# Patient Record
Sex: Female | Born: 1990 | Race: White | Hispanic: No | Marital: Single | Smoking: Never smoker
Health system: Southern US, Community
[De-identification: ages and names within clinical notes are randomized; demographics above are authoritative.]

## PROBLEM LIST (undated history)

## (undated) DIAGNOSIS — J45909 Unspecified asthma, uncomplicated: Secondary | ICD-10-CM

## (undated) DIAGNOSIS — F0781 Postconcussional syndrome: Secondary | ICD-10-CM

## (undated) HISTORY — PX: WISDOM TOOTH EXTRACTION: SHX21

## (undated) HISTORY — DX: Postconcussional syndrome: F07.81

---
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
## 2012-02-12 ENCOUNTER — Ambulatory Visit (INDEPENDENT_AMBULATORY_CARE_PROVIDER_SITE_OTHER): Payer: BC Managed Care – PPO | Admitting: Internal Medicine

## 2012-02-12 ENCOUNTER — Encounter: Payer: Self-pay | Admitting: Women's Health

## 2012-02-12 ENCOUNTER — Ambulatory Visit: Payer: BC Managed Care – PPO

## 2012-02-12 ENCOUNTER — Ambulatory Visit (INDEPENDENT_AMBULATORY_CARE_PROVIDER_SITE_OTHER): Payer: BC Managed Care – PPO | Admitting: Women's Health

## 2012-02-12 VITALS — BP 110/66 | HR 65 | Temp 98.1°F | Resp 16 | Ht 66.25 in | Wt 175.2 lb

## 2012-02-12 VITALS — BP 108/72 | Ht 66.75 in | Wt 173.0 lb

## 2012-02-12 DIAGNOSIS — N926 Irregular menstruation, unspecified: Secondary | ICD-10-CM | POA: Insufficient documentation

## 2012-02-12 DIAGNOSIS — S300XXA Contusion of lower back and pelvis, initial encounter: Secondary | ICD-10-CM

## 2012-02-12 DIAGNOSIS — Z01419 Encounter for gynecological examination (general) (routine) without abnormal findings: Secondary | ICD-10-CM

## 2012-02-12 DIAGNOSIS — S301XXA Contusion of abdominal wall, initial encounter: Secondary | ICD-10-CM

## 2012-02-12 DIAGNOSIS — M545 Low back pain: Secondary | ICD-10-CM

## 2012-02-12 MED ORDER — NORETHIN ACE-ETH ESTRAD-FE 1-20 MG-MCG PO TABS: 1.0000 | ORAL_TABLET | Freq: Every day | ORAL | Status: DC

## 2012-02-12 MED ORDER — IBUPROFEN 600 MG PO TABS: 600.0000 mg | ORAL_TABLET | Freq: Three times a day (TID) | ORAL | Status: AC | PRN

## 2012-02-12 NOTE — Patient Instructions (Addendum)
Health Maintenance, 18- to 19-Year-Old SCHOOL PERFORMANCE After high school completion, the Deanna Frank adult may be attending college, technical or vocational school, or entering the military or the work force. SOCIAL AND EMOTIONAL DEVELOPMENT The Deanna Frank adult establishes adult relationships and explores sexual identity. Deanna Frank adults may be living at home or in a college dorm or apartment. Increasing independence is important with Deanna Frank adults. Throughout adolescence, teens should assume responsibility of their own health care. IMMUNIZATIONS Most Deanna Frank adults should be fully vaccinated. A booster dose of Tdap (tetanus, diphtheria, and pertussis, or "whooping cough"), a dose of meningococcal vaccine to protect against a certain type of bacterial meningitis, hepatitis A, human papillomarvirus (HPV), chickenpox, or measles vaccines may be indicated, if not given at an earlier age. Annual influenza or "flu" vaccination should be considered during flu season.  TESTING Annual screening for vision and hearing problems is recommended. Vision should be screened objectively at least once between 18 and 19 years of age. The Deanna Frank adult may be screened for anemia or tuberculosis. Deanna Frank adults should have a blood test to check for high cholesterol during this time period. Deanna Frank adults should be screened for use of alcohol and drugs. If the Deanna Frank adult is sexually active, screening for sexually transmitted infections, pregnancy, or HIV may be performed. Screening for cervical cancer should be performed within 3 years of beginning sexual activity. NUTRITION AND ORAL HEALTH  Adequate calcium intake is important. Consume 3 servings of low-fat milk and dairy products daily. For those who do not drink milk or consume dairy products, calcium enriched foods, such as juice, bread, or cereal, dark, leafy greens, or canned fish are alternate sources of calcium.   Drink plenty of water. Limit fruit juice to 8 to 12 ounces per day.  Avoid sugary beverages or sodas.   Discourage skipping meals, especially breakfast. Teens should eat a good variety of vegetables and fruits, as well as lean meats.   Avoid high fat, high salt, and high sugar foods, such as candy, chips, and cookies.   Encourage Deanna Frank adults to participate in meal planning and preparation.   Eat meals together as a family whenever possible. Encourage conversation at mealtime.   Limit fast food choices and eating out at restaurants.   Brush teeth twice a day and floss.   Schedule dental exams twice a year.  SLEEP Regular sleep habits are important. PHYSICAL, SOCIAL, AND EMOTIONAL DEVELOPMENT  One hour of regular physical activity daily is recommended. Continue to participate in sports.   Encourage Deanna Frank adults to develop their own interests and consider community service or volunteerism.   Provide guidance to the Deanna Frank adult in making decisions about college and work plans.   Make sure that Deanna Frank adults know that they should never be in a situation that makes them uncomfortable, and they should tell partners if they do not want to engage in sexual activity.   Talk to the Deanna Frank adult about body image. Eating disorders may be noted at this time. Deanna Frank adults may also be concerned about being overweight. Monitor the Deanna Frank adult for weight gain or loss.   Mood disturbances, depression, anxiety, alcoholism, or attention problems may be noted in Deanna Frank adults. Talk to the caregiver if there are concerns about mental illness.   Negotiate limit setting and independent decision making.   Encourage the Deanna Frank adult to handle conflict without physical violence.   Avoid loud noises which may impair hearing.   Limit television and computer time to 2 hours per day.   Individuals who engage in excessive sedentary activity are more likely to become overweight.  RISK BEHAVIORS  Sexually active Deanna Frank adults need to take precautions against pregnancy and sexually  transmitted infections. Talk to Deanna Frank adults about contraception.   Provide a tobacco-free and drug-free environment for the Deanna Frank adult. Talk to the Deanna Frank adult about drug, tobacco, and alcohol use among friends or at friends' homes. Make sure the Deanna Frank adult knows that smoking tobacco or marijuana and taking drugs have health consequences and may impact brain development.   Teach the Deanna Frank adult about appropriate use of over-the-counter or prescription medicines.   Establish guidelines for driving and for riding with friends.   Talk to Deanna Frank adults about the risks of drinking and driving or boating. Encourage the Deanna Frank adult to call you if he or she or friends have been drinking or using drugs.   Remind Deanna Frank adults to wear seat belts at all times in cars and life vests in boats.   Deanna Frank adults should always wear a properly fitted helmet when they are riding a bicycle.   Use caution with all-terrain vehicles (ATVs) or other motorized vehicles.   Do not keep handguns in the home. (If you do, the gun and ammunition should be locked separately and out of the Deanna Frank adult's access.)   Equip your home with smoke detectors and change the batteries regularly. Make sure all family members know the fire escape plans for your home.   Teach Deanna Frank adults not to swim alone and not to dive in shallow water.   All individuals should wear sunscreen that protects against UVA and UVB light with at least a sun protection factor (SPF) of 30 when out in the sun. This minimizes sun burning.  WHAT'S NEXT? Deanna Frank adults should visit their pediatrician or family physician yearly. By Deanna Frank adulthood, health care should be transitioned to a family physician or internal medicine specialist. Sexually active females may want to begin annual physical exams with a gynecologist. Document Released: 09/24/2006 Document Revised: 06/18/2011 Document Reviewed: 10/14/2006 ExitCare Patient Information 2012 [employer]. 

## 2012-02-12 NOTE — Progress Notes (Signed)
LAVONNA LAMPRON [birth year] [id number]    History:    The patient presents for annual exam.  Cycles vary from every 3-12 weeks for 5-7 days. [place]. States cycles space when active with sports. Gardasil series completed several years ago. Denies any health problems, fell in the shower yesterday and hit her head, states feeling better now, was seen at an urgent care..   Past medical history, past surgical history, family history and social history were all reviewed and documented in the EPIC chart. Graduated third in her class, attending [employer]. 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   ROS:  A  ROS was performed and pertinent positives and negatives are included in the history.  Exam:  Filed Vitals:   02/12/12 1423  BP: 108/72    General appearance:  Normal Head/Neck:  Normal, without cervical or supraclavicular adenopathy. Thyroid:  Symmetrical, normal in size, without palpable masses or nodularity. Respiratory  Effort:  Normal  Auscultation:  Clear without wheezing or rhonchi Cardiovascular  Auscultation:  Regular rate, without rubs, murmurs or gallops  Edema/varicosities:  Not grossly evident Abdominal  Soft,nontender, without masses, guarding or rebound.  Liver/spleen:  No organomegaly noted  Hernia:  None appreciated  Skin  Inspection:  Grossly normal  Palpation:  Grossly normal Neurologic/psychiatric  Orientation:  Normal with appropriate conversation.  Mood/affect:  Normal  Genitourinary    Breasts: Examined lying and sitting.     Right: Without masses, retractions, discharge or axillary adenopathy.     Left: Without masses, retractions, discharge or axillary adenopathy.   Inguinal/mons:  Normal without inguinal adenopathy  External genitalia:  Normal  BUS/Urethra/Skene's glands:  Normal  Bladder:  Normal  Vagina:  Normal  Cervix:  Normal  Uterus:   normal in size, shape and contour.  Midline and mobile  Adnexa/parametria:      Rt: Without masses or tenderness.   Lt: Without masses or tenderness.  Anus and perineum: Normal    Assessment/Plan:  21 y.o. SWF [place] for annual exam with complaint of irregular cycles, anywhere between 3 and 12 weeks.  Irregular cycles Gardasil completed  Plan: CBC, TSH, prolactin, UA. Reviewed options to help with cycle regulation. Would like to try birth control pills. Loestrin 1/20 prescription, proper use, given and reviewed. Will start first day of next cycle. Instructed to call if cycles do not regulate. Reviewed importance of condoms if becomes sexually active. SBE's, exercise, calcium rich diet, MVI daily encouraged, campus safety reviewed.    Harrington Challenger WHNP, 3:01 PM 02/12/2012

## 2012-02-12 NOTE — Progress Notes (Signed)
  Subjective:    Patient ID: Deanna Frank, female    DOB: [birth year], 21 y.o.   MRN: [id number]  HPI At home, slipped in tub and fell hard on her rear end, felt immediate pain in mid lumbar vertebrae. No radiation of pain, notingling and no hx of stinger sxs, no weakness or numbness. Only has pain at L2-4   Review of Systems Concussions x4 and continues to play soccer    Objective:   Physical Exam  Constitutional: She is oriented to person, place, and time.  Musculoskeletal: She exhibits tenderness.  Neurological: She is alert and oriented to person, place, and time. She has normal strength and normal reflexes. No sensory deficit. Coordination and gait normal.  Reflex Scores:      Patellar reflexes are 2+ on the right side and 2+ on the left side.      Achilles reflexes are 2+ on the right side and 2+ on the left side. Skin: Skin is warm, dry and intact. No abrasion and no ecchymosis noted.          No eccymosis or swelling seen  Psychiatric: She has a normal mood and affect.   Point tender at mid lumbar spine  [facility] reading (PRIMARY) by  Dr.Guest.       Assessment & Plan:  Contusion/strain ls spine Back manual

## 2012-02-14 LAB — URINALYSIS W MICROSCOPIC + REFLEX CULTURE
Bacteria, UA: NONE SEEN
Bilirubin Urine: NEGATIVE
Casts: NONE SEEN
Crystals: NONE SEEN
Glucose, UA: 500 mg/dL — AB
Ketones, ur: NEGATIVE mg/dL
Specific Gravity, Urine: 1.032 — ABNORMAL HIGH (ref 1.005–1.030)
Urobilinogen, UA: 0.2 mg/dL (ref 0.0–1.0)

## 2012-02-17 ENCOUNTER — Other Ambulatory Visit: Payer: Self-pay | Admitting: Women's Health

## 2012-02-17 DIAGNOSIS — R81 Glycosuria: Secondary | ICD-10-CM

## 2012-04-29 ENCOUNTER — Other Ambulatory Visit: Payer: Self-pay | Admitting: Women's Health

## 2012-04-29 DIAGNOSIS — N926 Irregular menstruation, unspecified: Secondary | ICD-10-CM

## 2012-04-29 MED ORDER — NORETHIN ACE-ETH ESTRAD-FE 1-20 MG-MCG PO TABS: 1.0000 | ORAL_TABLET | Freq: Every day | ORAL | Status: DC

## 2012-05-31 ENCOUNTER — Other Ambulatory Visit: Payer: Self-pay | Admitting: Family Medicine

## 2012-05-31 NOTE — Telephone Encounter (Signed)
Please pull paper chart. Medication not on list in [facility].

## 2012-06-02 NOTE — Telephone Encounter (Signed)
Chart pulled at nurses station pa pool [id number]

## 2012-07-09 ENCOUNTER — Emergency Department (HOSPITAL_BASED_OUTPATIENT_CLINIC_OR_DEPARTMENT_OTHER)
Admission: EM | Admit: 2012-07-09 | Discharge: 2012-07-09 | Disposition: A | Discharge status: Home / Self Care | Payer: BC Managed Care – PPO | Attending: Emergency Medicine | Admitting: Emergency Medicine

## 2012-07-09 ENCOUNTER — Encounter (HOSPITAL_BASED_OUTPATIENT_CLINIC_OR_DEPARTMENT_OTHER): Payer: Self-pay | Admitting: *Deleted

## 2012-07-09 DIAGNOSIS — Z79899 Other long term (current) drug therapy: Secondary | ICD-10-CM | POA: Insufficient documentation

## 2012-07-09 DIAGNOSIS — J45990 Exercise induced bronchospasm: Secondary | ICD-10-CM | POA: Insufficient documentation

## 2012-07-09 DIAGNOSIS — Z23 Encounter for immunization: Secondary | ICD-10-CM | POA: Insufficient documentation

## 2012-07-09 DIAGNOSIS — Z87820 Personal history of traumatic brain injury: Secondary | ICD-10-CM | POA: Insufficient documentation

## 2012-07-09 DIAGNOSIS — Z203 Contact with and (suspected) exposure to rabies: Secondary | ICD-10-CM | POA: Insufficient documentation

## 2012-07-09 HISTORY — DX: Unspecified asthma, uncomplicated: J45.909

## 2012-07-09 MED ORDER — RABIES IMMUNE GLOBULIN 150 UNIT/ML IM INJ
20.0000 [IU]/kg | INJECTION | Freq: Once | INTRAMUSCULAR | Status: AC
  Administered 2012-07-09: 1500 [IU] via INTRAMUSCULAR
  Administered 2012-07-09: 300 [IU] via INTRAMUSCULAR

## 2012-07-09 MED ORDER — RABIES VACCINE, PCEC IM SUSR
1.0000 mL | Freq: Once | INTRAMUSCULAR | Status: AC
  Administered 2012-07-09: 1 mL via INTRAMUSCULAR
  Filled 2012-07-09 (×2): qty 1

## 2012-07-09 MED ORDER — RABIES IMMUNE GLOBULIN 150 UNIT/ML IM INJ
INJECTION | INTRAMUSCULAR | Status: AC
  Administered 2012-07-09: 300 [IU] via INTRAMUSCULAR
  Filled 2012-07-09: qty 12

## 2012-07-09 NOTE — ED Notes (Signed)
Pt states her family had a bat found in their house.  No active bite or exposure.

## 2012-07-09 NOTE — ED Notes (Signed)
Bat found in house, possible bite, no indications of bite

## 2012-07-09 NOTE — ED Notes (Signed)
Total of 1500 mg Rabies Immune Globulin given IM

## 2012-07-09 NOTE — ED Provider Notes (Signed)
History     CSN: [id number]  Arrival date & time 07/09/12  1007   First MD Initiated Contact with Patient 07/09/12 1055      Chief Complaint  Patient presents with  . Rabies Injection    (Consider location/radiation/quality/duration/timing/severity/associated sxs/prior treatment) HPI Pt reports there was a bat in their house for several days earlier in the week. No known bites. Pt here with several family members to begin Rabies series. She is asymptomatic.  Past Medical History  Diagnosis Date  . Concussion syndrome   . Asthma     exercise induced    History reviewed. No pertinent past surgical history.  Family History  Problem Relation Age of Onset  . Breast cancer Paternal Grandmother   . Cancer Paternal Grandfather     COLON    History  Substance Use Topics  . Smoking status: Never Smoker   . Smokeless tobacco: Never Used  . Alcohol Use: No    OB History    Grav Para Term Preterm Abortions TAB SAB Ect Mult Living                  Review of Systems  All other systems reviewed and are negative.    Allergies  Doxycycline  Home Medications   Current Outpatient Rx  Name  Route  Sig  Dispense  Refill  . NORETHIN ACE-ETH ESTRAD-FE 1-20 MG-MCG PO TABS   Oral   Take 1 tablet by mouth daily.   3 Package   4   . PROAIR HFA 108 (90 BASE) MCG/ACT IN AERS      inhale 2 puffs by mouth 1 MINUTE APART every 4 to 6 hours as directed   1 Inhaler   3     BP 114/71  Pulse 72  Temp 98.2 F (36.8 C)  Resp 18  SpO2 100%  Physical Exam  Constitutional: She is oriented to person, place, and time. She appears well-developed and well-nourished.  HENT:  Head: Normocephalic and atraumatic.  Neck: Neck supple.  Pulmonary/Chest: Effort normal.  Neurological: She is alert and oriented to person, place, and time. No cranial nerve deficit.  Psychiatric: She has a normal mood and affect. Her behavior is normal.    ED Course  Procedures (including critical  care time)  Labs Reviewed - No data to display No results found.   No diagnosis found.    MDM  Begin Rabies vaccination series        Ashlan Dignan B. Bernette Mayers, MD 07/09/12 1104

## 2012-07-12 ENCOUNTER — Encounter (HOSPITAL_COMMUNITY): Payer: Self-pay | Admitting: *Deleted

## 2012-07-12 ENCOUNTER — Emergency Department (INDEPENDENT_AMBULATORY_CARE_PROVIDER_SITE_OTHER)
Admission: EM | Admit: 2012-07-12 | Discharge: 2012-07-12 | Disposition: A | Discharge status: Home / Self Care | Payer: BC Managed Care – PPO | Source: Home / Self Care

## 2012-07-12 DIAGNOSIS — Z23 Encounter for immunization: Secondary | ICD-10-CM

## 2012-07-12 MED ORDER — RABIES VACCINE, PCEC IM SUSR
1.0000 mL | Freq: Once | INTRAMUSCULAR | Status: AC
  Administered 2012-07-12: 1 mL via INTRAMUSCULAR

## 2012-07-12 MED ORDER — RABIES VACCINE, PCEC IM SUSR
INTRAMUSCULAR | Status: AC
  Filled 2012-07-12: qty 1

## 2012-07-12 NOTE — ED Notes (Signed)
Presents for rabies injection. 

## 2012-07-16 ENCOUNTER — Emergency Department (INDEPENDENT_AMBULATORY_CARE_PROVIDER_SITE_OTHER)
Admission: EM | Admit: 2012-07-16 | Discharge: 2012-07-16 | Disposition: A | Discharge status: Home / Self Care | Payer: BC Managed Care – PPO | Source: Home / Self Care

## 2012-07-16 ENCOUNTER — Encounter (HOSPITAL_COMMUNITY): Payer: Self-pay | Admitting: *Deleted

## 2012-07-16 DIAGNOSIS — Z23 Encounter for immunization: Secondary | ICD-10-CM

## 2012-07-16 MED ORDER — RABIES VACCINE, PCEC IM SUSR
INTRAMUSCULAR | Status: AC
  Filled 2012-07-16: qty 1

## 2012-07-16 MED ORDER — RABIES VACCINE, PCEC IM SUSR
1.0000 mL | Freq: Once | INTRAMUSCULAR | Status: AC
  Administered 2012-07-16: 1 mL via INTRAMUSCULAR

## 2012-07-16 NOTE — ED Notes (Signed)
Presents for rabies injection. 

## 2012-07-23 ENCOUNTER — Encounter (HOSPITAL_COMMUNITY): Payer: Self-pay | Admitting: Emergency Medicine

## 2012-07-23 ENCOUNTER — Emergency Department (INDEPENDENT_AMBULATORY_CARE_PROVIDER_SITE_OTHER)
Admission: EM | Admit: 2012-07-23 | Discharge: 2012-07-23 | Disposition: A | Discharge status: Home / Self Care | Payer: BC Managed Care – PPO | Source: Home / Self Care

## 2012-07-23 DIAGNOSIS — Z203 Contact with and (suspected) exposure to rabies: Secondary | ICD-10-CM

## 2012-07-23 MED ORDER — RABIES VACCINE, PCEC IM SUSR
INTRAMUSCULAR | Status: AC
  Filled 2012-07-23: qty 1

## 2012-07-23 MED ORDER — RABIES VACCINE, PCEC IM SUSR
1.0000 mL | Freq: Once | INTRAMUSCULAR | Status: AC
  Administered 2012-07-23: 1 mL via INTRAMUSCULAR

## 2012-07-23 NOTE — ED Notes (Signed)
Here for the 4th rabies vacc 

## 2012-12-13 ENCOUNTER — Telehealth: Payer: Self-pay

## 2012-12-13 NOTE — Telephone Encounter (Signed)
Pt's mother is calling stating she would like to have a referral to a neurologist for Deanna Frank due to concussions   Best number is [phone]

## 2012-12-13 NOTE — Telephone Encounter (Signed)
1 yr since office visit for this, can we refer? Or do we need to see and evaluate first?

## 2012-12-13 NOTE — Telephone Encounter (Signed)
Please get more more information. Is something new going on?

## 2012-12-14 NOTE — Telephone Encounter (Signed)
lmom to cb. 

## 2012-12-14 NOTE — Telephone Encounter (Signed)
Called mother, patient went swimming at [place] this weekend/ and has had a new injury. She hit her head again, was sent to Urgent care in [place], they recommended a visit with Neurology. Please advise.

## 2012-12-14 NOTE — Telephone Encounter (Signed)
We will be happy to refer, however for continuity of care have them come in so we can get a history and perform an exam. We have to know what we are referring for other than a head injury that we did not evaluate them for.

## 2012-12-15 ENCOUNTER — Telehealth: Payer: Self-pay

## 2012-12-15 NOTE — Telephone Encounter (Signed)
Called her. No answer.

## 2012-12-15 NOTE — Telephone Encounter (Signed)
Thanks I called to advise. Left detailed message.

## 2012-12-15 NOTE — Telephone Encounter (Signed)
Pts mother is calling to speak to someone  Call back number [phone]

## 2013-04-23 ENCOUNTER — Ambulatory Visit: Payer: Managed Care, Other (non HMO) | Admitting: Internal Medicine

## 2013-04-23 VITALS — BP 112/68 | HR 79 | Temp 98.7°F | Resp 18 | Ht 66.25 in | Wt 158.8 lb

## 2013-04-23 DIAGNOSIS — L254 Unspecified contact dermatitis due to food in contact with skin: Secondary | ICD-10-CM

## 2013-04-23 DIAGNOSIS — J029 Acute pharyngitis, unspecified: Secondary | ICD-10-CM

## 2013-04-23 DIAGNOSIS — L259 Unspecified contact dermatitis, unspecified cause: Secondary | ICD-10-CM

## 2013-04-23 DIAGNOSIS — B084 Enteroviral vesicular stomatitis with exanthem: Secondary | ICD-10-CM

## 2013-04-23 LAB — POCT CBC
HCT, POC: 44.5 % (ref 37.7–47.9)
Hemoglobin: 14.2 g/dL (ref 12.2–16.2)
Lymph, poc: 1.5 (ref 0.6–3.4)
MCH, POC: 29.3 pg (ref 27–31.2)
MCHC: 31.9 g/dL (ref 31.8–35.4)
MCV: 91.8 fL (ref 80–97)
POC Granulocyte: 1.5 — AB (ref 2–6.9)
POC LYMPH PERCENT: 45.2 %L (ref 10–50)
RDW, POC: 12.4 %
WBC: 3.3 10*3/uL — AB (ref 4.6–10.2)

## 2013-04-23 MED ORDER — PREDNISONE 20 MG PO TABS: 20.0000 mg | ORAL_TABLET | Freq: Every day | ORAL | Status: DC

## 2013-04-23 NOTE — Progress Notes (Addendum)
Subjective:  This chart was scribed for Deanna Sia, MD by Quintella Reichert, ED scribe.  This patient was seen in room [facility] Room 1 and the patient's care was started at 1:52 PM.   Patient ID: Deanna Frank, female    DOB: June 16, 1991, 22 y.o.   MRN: [id number]  HPI  HPI Comments: Deanna Frank is a 22 y.o. female who presents with a chief complaint of a red rash on bilateral hands and feet that began 2 days ago.  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  She was found to have a temperature of 100.7 F and had blood-work drawn which revealed a slightly elevated WBC and she was placed on antibiotics.  Her fever has since resolved.  Her rash began shortly afterwards.  She also complains of a sore throat.  Pt denies recent exposure to children.  She does note that she got into posion ivy 2 weeks ago and has a separate rash associated with that exposure that seems to be spreading.  At [facility] Past Medical History  Diagnosis Date  . Concussion syndrome   . Asthma     exercise induced     Past Surgical History  Procedure Laterality Date  . Wisdom tooth extraction      07/11/12    Family History  Problem Relation Age of Onset  . Breast cancer Paternal Grandmother   . Cancer Paternal Grandfather     COLON    Allergies  Allergen Reactions  . 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  HENT: Negative for dental problem and drooling.   Eyes: Negative for photophobia and discharge.  Respiratory: Negative for cough and shortness of breath.   Gastrointestinal: Negative for nausea and diarrhea.  Musculoskeletal: Negative for arthralgias and joint swelling.         Objective:   Physical Exam  Nursing note and vitals reviewed. Constitutional:  She is oriented to person, place, and time. She appears well-developed and well-nourished. No distress.  HENT:  Right Ear: External ear normal.  Left Ear: External ear normal.  Nose: Nose normal.  Mouth/Throat: No oropharyngeal exudate.  Vesicular lesions on soft palate  Eyes: Conjunctivae and EOM are normal. Pupils are equal, round, and reactive to light.  Neck: No thyromegaly present.  Cardiovascular: Normal rate, regular rhythm, normal heart sounds and intact distal pulses.   No murmur heard. Pulmonary/Chest: Effort normal and breath sounds normal.  Lymphadenopathy:    She has no cervical adenopathy.  Neurological: She is alert and oriented to person, place, and time.   Palms and soles have numerous red macules, nontender.  All joints normal. Also has scattered linear erythem papules on lower legs and lower arms  BP 112/68  Pulse 79  Temp(Src) 98.7 F (37.1 C) (Oral)  Resp 18  Ht 5' 6.25" (1.683 m)  Wt 158 lb 12.8 oz (72.031 kg)  BMI 25.43 kg/m2  SpO2 98%  LMP 04/16/2013   Results for orders placed in visit on 04/23/13  POCT CBC      Result Value Range   WBC 3.3 (*) 4.6 - 10.2 K/uL   Lymph, poc 1.5  0.6 - 3.4   POC LYMPH PERCENT 45.2  10 - 50 %L   MID (cbc)  0.3  0 - 0.9   POC MID % 9.8  0 - 12 %M   POC Granulocyte 1.5 (*) 2 - 6.9   Granulocyte percent 45.0  37 - 80 %G   RBC 4.85  4.04 - 5.48 M/uL   Hemoglobin 14.2  12.2 - 16.2 g/dL   HCT, POC 40.9  81.1 - 47.9 %   MCV 91.8  80 - 97 fL   MCH, POC 29.3  27 - 31.2 pg   MCHC 31.9  31.8 - 35.4 g/dL   RDW, POC 91.4     Platelet Count, POC 234  142 - 424 K/uL   MPV 10.0  0 - 99.8 fL        Assessment & Plan:     Visit Diagnoses   Acute pharyngitis    -  Due to coxsackie    Relevant Orders       POCT CBC (Completed)--TC neg at [facility]    Hand, foot and mouth disease        Contact dermatitis        Relevant Medications       predniSONE (DELTASONE) tablet     D/c amox from [facility]  Meds ordered this  encounter  Medications  . predniSONE (DELTASONE) 20 MG tablet    Sig: Take 1 tablet (20 mg total) by mouth daily. 3/3/2/2/1/1 single daily dose for 6 days    Dispense:  12 tablet    Refill:  0      I have completed the patient encounter in its entirety as documented by the scribe, with editing by me where necessary. Tedi Hughson P. Merla Riches, M.D.

## 2013-07-30 IMAGING — CR DG LUMBAR SPINE COMPLETE 4+V
5 series · 5 of 5 positions shown · non-contrast
Comparison: None.

CLINICAL DATA: Fall, low back pain.

LUMBAR SPINE - COMPLETE 4+ VIEW

[AP]
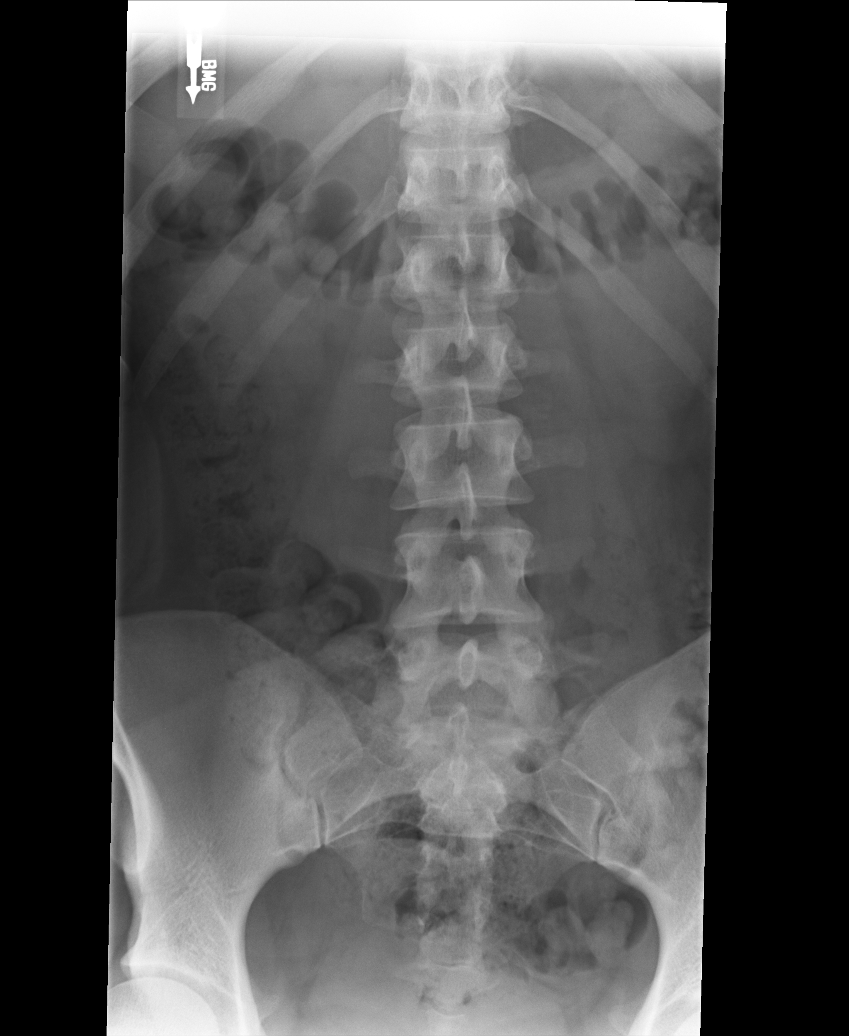

[lateral]
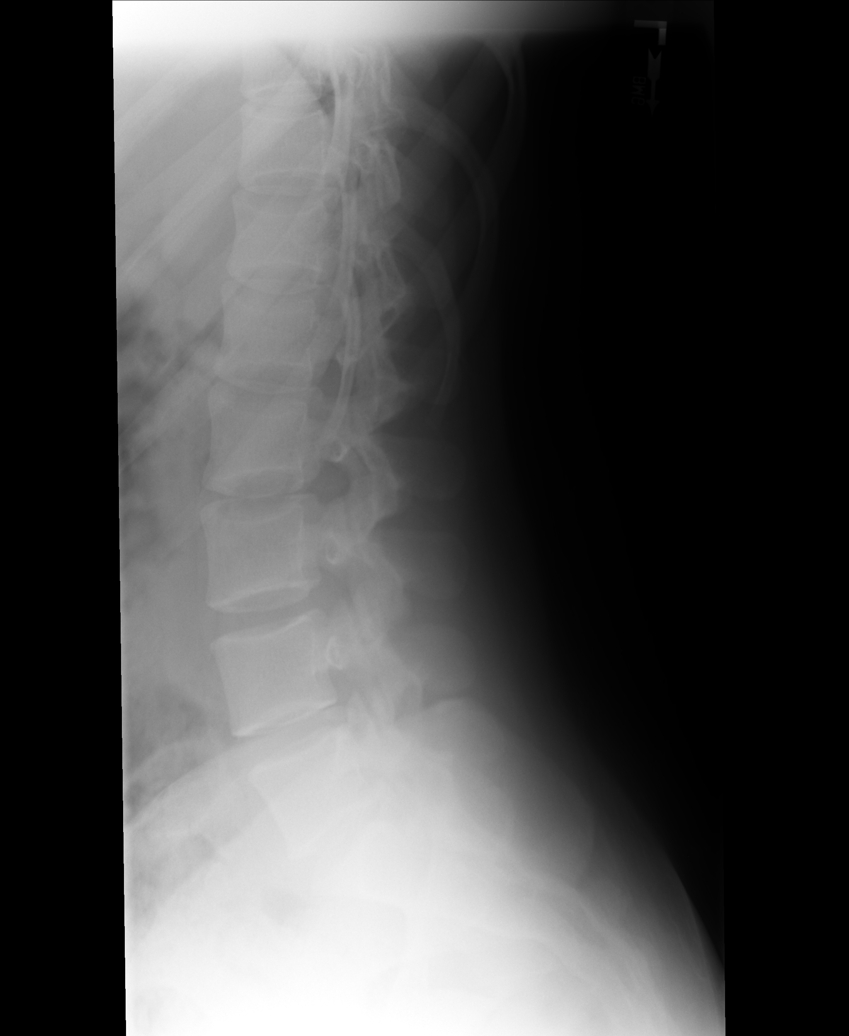

[l5 s1]
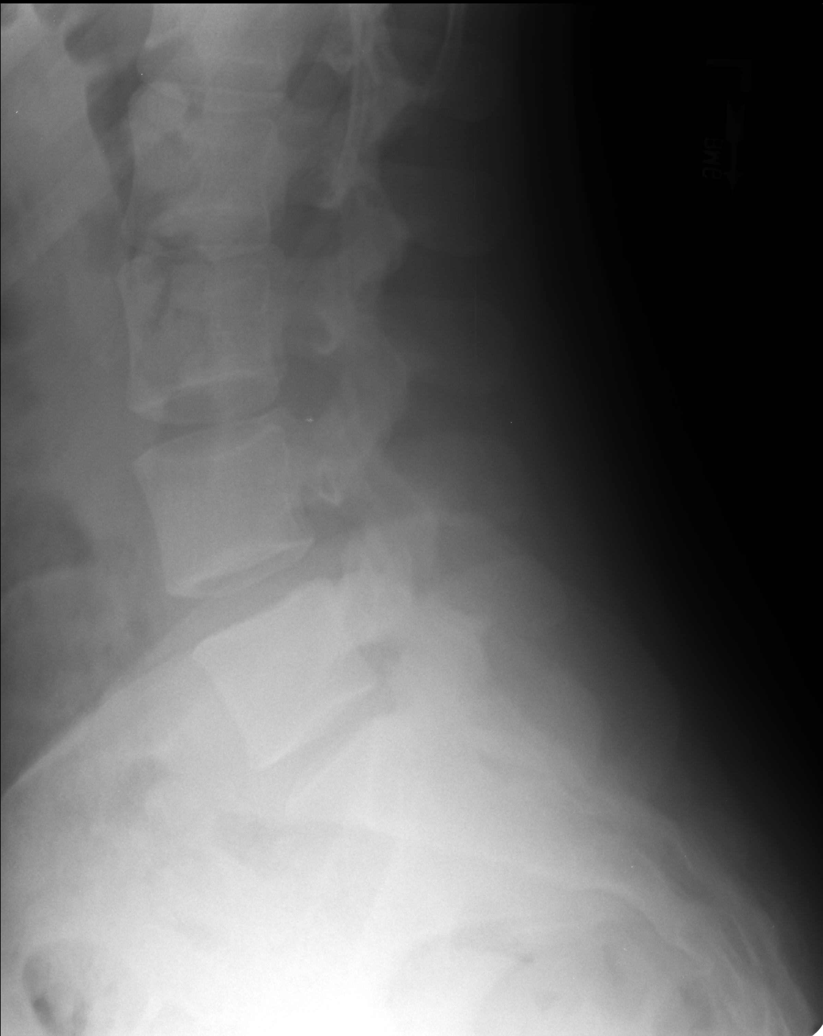

[rpo]
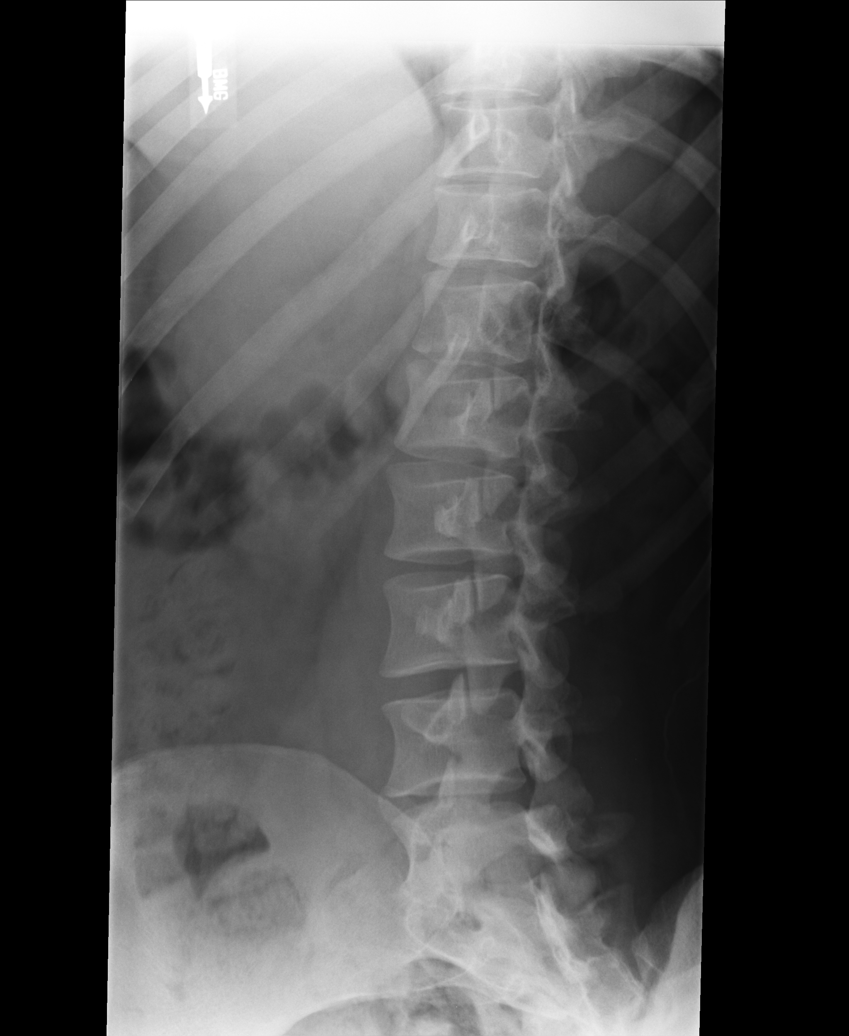

[lpo]
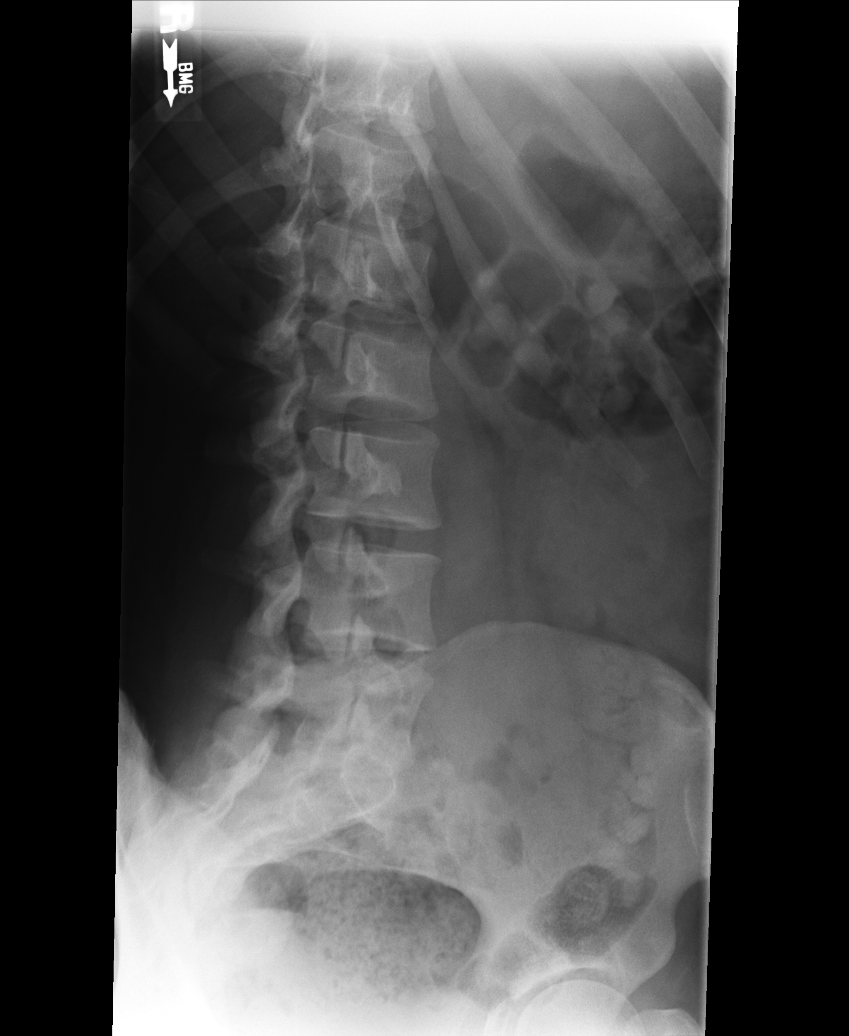

[5 of 5 positions shown; findings below may reference images not displayed]

FINDINGS: There are five lumbar-type vertebral bodies.  No fracture
or malalignment.  Disc spaces well maintained.  SI joints are
symmetric.
IMPRESSION: No bony abnormality.

Clinically significant discrepancy from primary report, if
provided: None

## 2013-09-21 ENCOUNTER — Encounter: Payer: Self-pay | Admitting: Women's Health

## 2013-09-21 ENCOUNTER — Ambulatory Visit (INDEPENDENT_AMBULATORY_CARE_PROVIDER_SITE_OTHER): Payer: Managed Care, Other (non HMO) | Admitting: Women's Health

## 2013-09-21 VITALS — BP 112/66 | Ht 66.0 in | Wt 170.0 lb

## 2013-09-21 DIAGNOSIS — Z113 Encounter for screening for infections with a predominantly sexual mode of transmission: Secondary | ICD-10-CM

## 2013-09-21 DIAGNOSIS — Z01419 Encounter for gynecological examination (general) (routine) without abnormal findings: Secondary | ICD-10-CM

## 2013-09-21 DIAGNOSIS — N926 Irregular menstruation, unspecified: Secondary | ICD-10-CM

## 2013-09-21 LAB — CBC WITH DIFFERENTIAL/PLATELET
BASOS PCT: 0 % (ref 0–1)
Basophils Absolute: 0 10*3/uL (ref 0.0–0.1)
Eosinophils Absolute: 0.1 10*3/uL (ref 0.0–0.7)
Eosinophils Relative: 1 % (ref 0–5)
HEMATOCRIT: 39.7 % (ref 36.0–46.0)
HEMOGLOBIN: 13.5 g/dL (ref 12.0–15.0)
LYMPHS ABS: 2.2 10*3/uL (ref 0.7–4.0)
Lymphocytes Relative: 39 % (ref 12–46)
MCH: 28.8 pg (ref 26.0–34.0)
MCHC: 34 g/dL (ref 30.0–36.0)
MCV: 84.6 fL (ref 78.0–100.0)
MONO ABS: 0.4 10*3/uL (ref 0.1–1.0)
MONOS PCT: 8 % (ref 3–12)
NEUTROS ABS: 2.9 10*3/uL (ref 1.7–7.7)
Neutrophils Relative %: 52 % (ref 43–77)
Platelets: 262 10*3/uL (ref 150–400)
RBC: 4.69 MIL/uL (ref 3.87–5.11)
RDW: 13.3 % (ref 11.5–15.5)
WBC: 5.6 10*3/uL (ref 4.0–10.5)

## 2013-09-21 MED ORDER — NORETHIN ACE-ETH ESTRAD-FE 1-20 MG-MCG PO TABS: 1.0000 | ORAL_TABLET | Freq: Every day | ORAL | Status: AC

## 2013-09-21 NOTE — Patient Instructions (Signed)
Health Maintenance, 44- to 21-Year-Old SCHOOL PERFORMANCE After high school completion, the Deanna Frank adult may be attending college, Hotel manager or vocational school, or entering the [employer] or the work force. SOCIAL AND EMOTIONAL DEVELOPMENT The Deanna Frank adult establishes adult relationships and explores sexual identity. Deanna Frank adults may be living at home or in a college dorm or apartment. Increasing independence is important with Deanna Frank adults. Throughout these years, Deanna Frank adults should assume responsibility of their own health care. RECOMMENDED IMMUNIZATIONS  Influenza vaccine.  All adults should be immunized every year.  All adults, including pregnant women and people with hives-only allergy to eggs can receive the inactivated influenza (IIV) vaccine.  Adults aged 21 49 years can receive the recombinant influenza (RIV) vaccine. The RIV vaccine does not contain any egg protein.  Tetanus, diphtheria, and acellular pertussis (Td, Tdap) vaccine.  Pregnant women should receive 1 dose of Tdap vaccine during each pregnancy. The dose should be obtained regardless of the length of time since the last dose. Immunization is preferred during the 27th to 36th week of gestation.  An adult who has not previously received Tdap or who does not know his or her vaccine status should receive 1 dose of Tdap. This initial dose should be followed by tetanus and diphtheria toxoids (Td) booster doses every 10 years.  Adults with an unknown or incomplete history of completing a 3-dose immunization series with Td-containing vaccines should begin or complete a primary immunization series including a Tdap dose.  Adults should receive a Td booster every 10 years.  Varicella vaccine.  An adult without evidence of immunity to varicella should receive 2 doses or a second dose if he or she has previously received 1 dose.  Pregnant females who do not have evidence of immunity should receive the first dose after pregnancy.  This first dose should be obtained before leaving the health care facility. The second dose should be obtained 4 8 weeks after the first dose.  Human papillomavirus (HPV) vaccine.  Females aged 21 26 years who have not received the vaccine previously should obtain the 3-dose series.  The vaccine is not recommended for use in pregnant females. However, pregnancy testing is not needed before receiving a dose. If a female is found to be pregnant after receiving a dose, no treatment is needed. In that case, the remaining doses should be delayed until after the pregnancy.  Males aged 21 21 years who have not received the vaccine previously should receive the 3-dose series. Males aged 39 26 years may be immunized.  Immunization is recommended through the age of 1 years for any female who has sex with males and did not get any or all doses earlier.  Immunization is recommended for any person with an immunocompromised condition through the age of 27 years if he or she did not get any or all doses earlier.  During the 3-dose series, the second dose should be obtained 4 8 weeks after the first dose. The third dose should be obtained 24 weeks after the first dose and 16 weeks after the second dose.  Measles, mumps, and rubella (MMR) vaccine.  Adults born in [birth year] or later should have 1 or more doses of MMR vaccine unless there is a contraindication to the vaccine or there is laboratory evidence of immunity to each of the three diseases.  A routine second dose of MMR vaccine should be obtained at least 28 days after the first dose for students attending postsecondary schools, health care workers, or international travelers.  For females of childbearing age, rubella immunity should be determined. If there is no evidence of immunity, females who are not pregnant should be vaccinated. If there is no evidence of immunity, females who are pregnant should delay immunization until after pregnancy.  Pneumococcal  13-valent conjugate (PCV13) vaccine.  When indicated, a person who is uncertain of his or her immunization history and has no record of immunization should receive the PCV13 vaccine.  An adult aged 21 years or older who has certain medical conditions and has not been previously immunized should receive 1 dose of PCV13 vaccine. This PCV13 should be followed with a dose of pneumococcal polysaccharide (PPSV23) vaccine. The PPSV23 vaccine dose should be obtained at least 8 weeks after the dose of PCV13 vaccine.  An adult aged 21 years or older who has certain medical conditions and previously received 1 or more doses of PPSV23 vaccine should receive 1 dose of PCV13. The PCV13 vaccine dose should be obtained 1 or more years after the last PPSV23 vaccine dose.  Pneumococcal polysaccharide (PPSV23) vaccine.  When PCV13 is also indicated, PCV13 should be obtained first.  An adult younger than age 65 years who has certain medical conditions should be immunized.  Any person who resides in a nursing home or long-term care facility should be immunized.  An adult smoker should be immunized.  People with an immunocompromised condition and certain other conditions should receive both PCV13 and PPSV23 vaccines.  People with human immunodeficiency virus (HIV) infection should be immunized as soon as possible after diagnosis.  Immunization during chemotherapy or radiation therapy should be avoided.  Routine use of PPSV23 vaccine is not recommended for American Indians, [place] Natives, or people younger than 65 years unless there are medical conditions that require PPSV23 vaccine.  When indicated, people who have unknown immunization and have no record of immunization should receive PPSV23 vaccine.  One-time revaccination 5 years after the first dose of PPSV23 is recommended for people aged 21 64 years who have chronic kidney failure, nephrotic syndrome, asplenia, or immunocompromised  conditions.  Meningococcal vaccine.  Adults with asplenia or persistent complement component deficiencies should receive 2 doses of quadrivalent meningococcal conjugate (MenACWY-D) vaccine. The doses should be obtained at least 2 months apart.  Microbiologists working with certain meningococcal bacteria, military recruits, people at risk during an outbreak, and people who travel to or live in countries with a high rate of meningitis should be immunized.  A first-year college student up through age 21 years who is living in a residence hall should receive a dose if he or she did not receive a dose on or after his or her 16th birthday.  Adults who have certain high-risk conditions should receive one or more doses of vaccine.  Hepatitis A vaccine.  Adults who wish to be protected from this disease, have certain high-risk conditions, work with hepatitis A-infected animals, work in hepatitis A research labs, or travel to or work in countries with a high rate of hepatitis A should be immunized.  Adults who were previously unvaccinated and who anticipate close contact with an international adoptee during the first 60 days after arrival in the [place] from a country with a high rate of hepatitis A should be immunized.  Hepatitis B vaccine.  Adults who wish to be protected from this disease, have certain high-risk conditions, may be exposed to blood or other infectious body fluids, are household contacts or sex partners of hepatitis B positive people, are clients or workers in   certain care facilities, or travel to or work in countries with a high rate of hepatitis B should be immunized.  Haemophilus influenzae type b (Hib) vaccine.  A previously unvaccinated person with asplenia or sickle cell disease or having a scheduled splenectomy should receive 1 dose of Hib vaccine.  Regardless of previous immunization, a recipient of a hematopoietic stem cell transplant should receive a 3-dose series 6  12 months after his or her successful transplant.  Hib vaccine is not recommended for adults with HIV infection. TESTING Annual screening for vision and hearing problems is recommended. Vision should be screened objectively at least once between 18 21 years of age. The Deanna Frank adult may be screened for anemia or tuberculosis. Deanna Frank adults should have a blood test to check for high cholesterol during this time period. Deanna Frank adults should be screened for use of alcohol and drugs. If the Deanna Frank adult is sexually active, screening for sexually transmitted infections, pregnancy, or HIV may be performed.  NUTRITION AND ORAL HEALTH  Adequate calcium intake is important. Consume 3 servings of low-fat milk and dairy products daily. For those who do not drink milk or consume dairy products, calcium enriched foods, such as juice, bread, or cereal, dark, leafy greens, or canned fish are alternate sources of calcium.  Drink plenty of water. Limit fruit juice to 8 12 ounces (240 360 mL) each day. Avoid sugary beverages or sodas.  Discourage skipping meals, especially breakfast. Deanna Frank adults should eat a good variety of vegetables and fruits, as well as lean meats.  Avoid foods high in fat, salt, or sugar, such as candy, chips, and cookies.  Encourage Deanna Frank adults to participate in meal planning and preparation.  Eat meals together as a family whenever possible. Encourage conversation at mealtime.  Limit fast food choices and eating out at restaurants.  Brush teeth twice a day and floss.  Schedule dental exams twice a year. SLEEP Regular sleep habits are important. PHYSICAL, SOCIAL, AND EMOTIONAL DEVELOPMENT  One hour of regular physical activity daily is recommended. Continue to participate in sports.  Encourage Deanna Frank adults to develop their own interests and consider community service or volunteerism.  Provide guidance to the Deanna Frank adult in making decisions about college and work plans.  Make sure  that Deanna Frank adults know that they should never be in a situation that makes them uncomfortable, and they should tell partners if they do not want to engage in sexual activity.  Talk to the Deanna Frank adult about body image. Eating disorders may be noted at this time. Deanna Frank adults may also be concerned about being overweight. Monitor the Deanna Frank adult for weight gain or loss.  Mood disturbances, depression, anxiety, alcoholism, or attention problems may be noted in Deanna Majeed adults. Talk to the caregiver if there are concerns about mental illness.  Negotiate limit setting and independent decision making.  Encourage the Emmert Roethler adult to handle conflict without physical violence.  Avoid loud noises which may impair hearing.  Limit television and computer time to 2 hours each day. Individuals who engage in excessive sedentary activity are more likely to become overweight. RISK BEHAVIORS  Sexually active Omunique Pederson adults need to take precautions against pregnancy and sexually transmitted infections. Talk to Subrena Devereux adults about contraception.  Provide a tobacco-free and drug-free environment for the Litisha Guagliardo adult. Talk to the Bodey Frizell adult about drug, tobacco, and alcohol use among friends or at friend's homes. Make sure the Meili Kleckley adult knows that smoking tobacco or marijuana and taking drugs have health consequences and   may impact brain development.  Teach the Nelissa Bolduc adult about appropriate use of over-the-counter or prescription medicines.  Establish guidelines for driving and for riding with friends.  Talk to Rubby Barbary adults about the risks of drinking and driving or boating. Encourage the Edin Skarda adult to call you if he or she or friends have been drinking or using drugs.  Remind Isiaha Greenup adults to wear seat belts at all times in cars and life vests in boats.  Sender Rueb adults should always wear a properly fitted helmet when they are riding a bicycle.  Use caution with all-terrain vehicles (ATVs) or other motorized  vehicles.  Do not keep handguns in the home. (If you do, the gun and ammunition should be locked separately and out of the Braylyn Eye adult's access.)  Equip your home with smoke detectors and change the batteries regularly. Make sure all family members know the fire escape plans for your home.  Teach Zakara Parkey adults not to swim alone and not to dive in shallow water.  All individuals should wear sunscreen when out in the sun. This minimizes sunburning. WHAT'S NEXT? Nataley Bahri adults should visit their pediatrician or family physician yearly. By Peyten Weare adulthood, health care should be transitioned to a family physician or internal medicine specialist. Sexually active females may want to begin annual physical exams with a gynecologist. Document Released: 09/24/2006 Document Revised: 10/24/2012 Document Reviewed: 10/14/2006 Exit[employer] Patient Information 2014 [employer].  

## 2013-09-21 NOTE — Progress Notes (Signed)
Deanna Frank 1990/10/19 [id number]    History:    Presents for annual exam.  Monthly cycle on Loestrin/good cycle regulation/cycles had been every 3-12 weeks. Sexually active first partner/not his. Gardasil series completed. 04/2013 WBC 3.3  family practice.  Past medical history, past surgical history, family history and social history were all reviewed and documented in the EPIC chart. 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 Paternal grandmother breast cancer. Paternal grandfather colon cancer.  ROS:  A  ROS was performed and pertinent positives and negatives are included.  Exam:  Filed Vitals:   09/21/13 1434  BP: 112/66    General appearance:  Normal Thyroid:  Symmetrical, normal in size, without palpable masses or nodularity. Respiratory  Auscultation:  Clear without wheezing or rhonchi Cardiovascular  Auscultation:  Regular rate, without rubs, murmurs or gallops  Edema/varicosities:  Not grossly evident Abdominal  Soft,nontender, without masses, guarding or rebound.  Liver/spleen:  No organomegaly noted  Hernia:  None appreciated  Skin  Inspection:  Grossly normal   Breasts: Examined lying and sitting.     Right: Without masses, retractions, discharge or axillary adenopathy.     Left: Without masses, retractions, discharge or axillary adenopathy. Gentitourinary   Inguinal/mons:  Normal without inguinal adenopathy  External genitalia:  Normal  BUS/Urethra/Skene's glands:  Normal  Vagina:  Normal  Cervix:  Normal  Uterus:  normal in size, shape and contour.  Midline and mobile  Adnexa/parametria:     Rt: Without masses or tenderness.   Lt: Without masses or tenderness.  Anus and perineum: Normal    Assessment/Plan:  23 y.o. SWF G0 for annual exam with no complaints.  Normal GYN exam STD screen  Plan: Loestrin 1/20 prescription, proper use given and reviewed risk for blood clots and strokes. Condoms encouraged until permanent  partner. SBE's, regular exercise, calcium rich diet, MVI daily encouraged. Campus safety reviewed. CBC, UA, GC/Chlamydia, HIV, hep B, C., RPR.    Harrington ChallengerYOUNG,Kambree Krauss J WHNP, 3:15 PM 09/21/2013

## 2013-09-22 LAB — HEPATITIS B SURFACE ANTIGEN: HEP B S AG: NEGATIVE

## 2013-09-22 LAB — URINALYSIS W MICROSCOPIC + REFLEX CULTURE
BACTERIA UA: NONE SEEN
Bilirubin Urine: NEGATIVE
CRYSTALS: NONE SEEN
Casts: NONE SEEN
Glucose, UA: NEGATIVE mg/dL
Hgb urine dipstick: NEGATIVE
KETONES UR: NEGATIVE mg/dL
Leukocytes, UA: NEGATIVE
NITRITE: NEGATIVE
Protein, ur: NEGATIVE mg/dL
SPECIFIC GRAVITY, URINE: 1.025 (ref 1.005–1.030)
SQUAMOUS EPITHELIAL / LPF: NONE SEEN
UROBILINOGEN UA: 0.2 mg/dL (ref 0.0–1.0)
pH: 5.5 (ref 5.0–8.0)

## 2013-09-22 LAB — HEPATITIS C ANTIBODY: HCV AB: NEGATIVE

## 2013-09-22 LAB — HIV ANTIBODY (ROUTINE TESTING W REFLEX): HIV: NONREACTIVE

## 2013-09-22 LAB — GC/CHLAMYDIA PROBE AMP
CT Probe RNA: NEGATIVE
GC PROBE AMP APTIMA: NEGATIVE

## 2013-09-22 LAB — RPR

## 2014-10-05 ENCOUNTER — Other Ambulatory Visit: Payer: Self-pay | Admitting: Women's Health
